# Patient Record
Sex: Female | Born: 2000 | Race: Black or African American | Hispanic: No | Marital: Single | Smoking: Never smoker
Health system: Southern US, Community
[De-identification: ages and names within clinical notes are randomized; demographics above are authoritative.]

## PROBLEM LIST (undated history)

## (undated) DIAGNOSIS — L709 Acne, unspecified: Secondary | ICD-10-CM

## (undated) HISTORY — PX: WISDOM TOOTH EXTRACTION: SHX21

## (undated) HISTORY — DX: Acne, unspecified: L70.9

---
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
## 2022-04-30 ENCOUNTER — Encounter: Payer: Self-pay | Admitting: Radiology

## 2022-04-30 ENCOUNTER — Ambulatory Visit: Payer: Managed Care, Other (non HMO) | Admitting: Radiology

## 2022-04-30 VITALS — BP 102/60 | Ht 67.0 in | Wt 122.0 lb

## 2022-04-30 DIAGNOSIS — N926 Irregular menstruation, unspecified: Secondary | ICD-10-CM

## 2022-04-30 LAB — PREGNANCY, URINE: Preg Test, Ur: NEGATIVE

## 2022-04-30 NOTE — Progress Notes (Signed)
      Subjective: Anna Mcdonald is a 22 y.o. female who complains of irregular menses since 09/2021. Skipping menses and having 2 menses the next month. Vaginal spotting started 02/22/2 through today. LNMP: 04/02/22. Began a few months after starting Accutane    Review of Systems  All other systems reviewed and are negative.   History reviewed. No pertinent past medical history.    Objective:  Today's Vitals   04/30/22 1039  BP: 102/60  Weight: 122 lb (55.3 kg)  Height: '5\' 7"'$  (1.702 m)   Body mass index is 19.11 kg/m.   Physical Exam Vitals and nursing note reviewed. Exam conducted with a chaperone present.  Constitutional:      Appearance: She is normal weight.  Pulmonary:     Effort: Pulmonary effort is normal.  Abdominal:     General: Abdomen is flat. Bowel sounds are normal.     Palpations: Abdomen is soft.  Genitourinary:    General: Normal vulva.     Uterus: Normal.      Adnexa: Right adnexa normal and left adnexa normal.  Lymphadenopathy:     Lower Body: No right inguinal adenopathy.  Neurological:     Mental Status: She is alert. Mental status is at baseline.  Psychiatric:        Mood and Affect: Mood normal.        Thought Content: Thought content normal.        Judgment: Judgment normal.      Leatrice Jewels, CMA present for exam  Assessment:/Plan:   1. Irregular periods Reassured it may be related to Accutane use Declines hormonal management at this time - Pregnancy, urine   Call if she changes her mind about hormonal [facility]

## 2022-08-06 ENCOUNTER — Ambulatory Visit (HOSPITAL_COMMUNITY)
Admission: EM | Admit: 2022-08-06 | Discharge: 2022-08-06 | Disposition: A | Discharge status: Home / Self Care | Payer: Managed Care, Other (non HMO) | Attending: Emergency Medicine | Admitting: Emergency Medicine

## 2022-08-06 ENCOUNTER — Other Ambulatory Visit: Payer: Self-pay

## 2022-08-06 DIAGNOSIS — R079 Chest pain, unspecified: Secondary | ICD-10-CM | POA: Diagnosis not present

## 2022-08-06 NOTE — ED Provider Notes (Signed)
[facility]    CSN: [id number] Arrival date & time: 08/06/22  1800      History   Chief Complaint Chief Complaint  Patient presents with   Chest Pain    Pt states she is having CP for a week, denies any SOB.     HPI Anna Mcdonald is a 22 y.o. female.   Patient presents to clinic complaining of chest pain that has been intermittent but worsening for the past week.  Feels like someone is sitting grabbing her heart and squeezing it, this happens intermittently, most recently happened about an hour ago in the lobby.  She has been having ongoing chest pains for months, usually after eating.  These are different because they occur intermittently, at rest, while laying down, eating or during activity.  Yesterday she had no pain, today she has had frequent pains.  She denies any shortness of breath, cough or recent illness. Irregular menses.   Her mother has a history of thyroid disease.  Mother is concerned over diet and Boba tea/caffeine intake, reports patient does not drink water.    The history is provided by the patient and medical records.  Chest Pain Associated symptoms: no cough, no fever, no palpitations and no shortness of breath     No past medical history on file.  There are no problems to display for this patient.   No past surgical history on file.  OB History     Gravida  0   Para  0   Term  0   Preterm  0   AB  0   Living  0      SAB  0   IAB  0   Ectopic  0   Multiple  0   Live Births  0            Home Medications    Prior to Admission medications   Not on File    Family History Family History  Problem Relation Age of Onset   Thyroid disease Mother     Social History Social History   Tobacco Use   Smoking status: Never    Passive exposure: Never   Smokeless tobacco: Never  Substance Use Topics   Alcohol use: Never   Drug use: Never     Allergies   Patient has no known allergies.   Review of  Systems Review of Systems  Constitutional:  Negative for fever.  Respiratory:  Negative for cough and shortness of breath.   Cardiovascular:  Positive for chest pain. Negative for palpitations.  Genitourinary:  Positive for menstrual problem.     Physical Exam Triage Vital Signs ED Triage Vitals [08/06/22 1831]  Enc Vitals Group     BP      Pulse      Resp      Temp      Temp src      SpO2      Weight 123 lb 7.3 oz (56 kg)     Height 5\' 7"  (1.702 m)     Head Circumference      Peak Flow      Pain Score 3     Pain Loc      Pain Edu?      Excl. in GC?    No data found.  Updated Vital Signs BP 96/61   Pulse 83   Temp 98 F (36.7 C)   Resp 16   Ht 5\' 7"  (1.702 m)  Wt 123 lb 7.3 oz (56 kg)   SpO2 99%   BMI 19.34 kg/m   Visual Acuity Right Eye Distance:   Left Eye Distance:   Bilateral Distance:    Right Eye Near:   Left Eye Near:    Bilateral Near:     Physical Exam Vitals and nursing note reviewed.  Constitutional:      Appearance: She is well-developed.  HENT:     Head: Normocephalic and atraumatic.  Eyes:     Pupils: Pupils are equal, round, and reactive to light.  Cardiovascular:     Rate and Rhythm: Normal rate and regular rhythm.     Heart sounds: Normal heart sounds. No murmur heard. Pulmonary:     Effort: Pulmonary effort is normal.     Breath sounds: Normal breath sounds. No decreased breath sounds.  Musculoskeletal:        General: Normal range of motion.     Cervical back: Normal range of motion.  Skin:    General: Skin is warm and dry.  Neurological:     General: No focal deficit present.     Mental Status: She is alert.  Psychiatric:        Mood and Affect: Mood normal.      UC Treatments / Results  Labs (all labs ordered are listed, but only abnormal results are displayed) Labs Reviewed - No data to display  EKG   Radiology No results found.  Procedures Procedures (including critical care time)  Medications Ordered  in UC Medications - No data to display  Initial Impression / Assessment and Plan / UC Course  I have reviewed the triage vital signs and the nursing notes.  Pertinent labs & imaging results that were available during my care of the patient were reviewed by me and considered in my medical decision making (see chart for details).  Vitals and triage reviewed, patient is hemodynamically stable.  Intermittent chest pains that have been worsening over the past week.  EKG shows normal sinus rhythm at 69 bpm, without ST elevation or ST depression. Does have rightward axis, patient w/o dyspnea, SOB or other concerning symptoms, suspect normal variation.  Advise follow-up with cardiology if symptoms persist for further evaluation.  Encouraged to establish with a PCP, patient does have irregular menses and mother has a history of thyroid disease, suggested routine labs with PCP for further evaluation.  Plan of care, follow-up care and return precautions given, no questions at this time.     Final Clinical Impressions(s) / UC Diagnoses   Final diagnoses:  Nonspecific chest pain     Discharge Instructions      Your rhythm is regular on your EKG.  Please follow-up with a primary care provider for further evaluation of your chest pains and irregular menstrual cycles, as they will be able to obtain routine labs.  I suggest cutting down on your caffeine intake, and ensure you are drinking at least 64 ounces of water.  Please ensure you are eating a balanced diet with plenty of fruits and vegetables.  If your chest pains persist, you can consider following up with a cardiologist, I have attached a female to contact if needed.   Please return to clinic for any new or concerning symptoms.       ED Prescriptions   None    PDMP not reviewed this encounter.   Nohea Kras, [place], [place] 08/06/22 Ernestina [place]

## 2022-08-06 NOTE — Discharge Instructions (Addendum)
Your rhythm is regular on your EKG.  Please follow-up with a primary care provider for further evaluation of your chest pains and irregular menstrual cycles, as they will be able to obtain routine labs.  I suggest cutting down on your caffeine intake, and ensure you are drinking at least 64 ounces of water.  Please ensure you are eating a balanced diet with plenty of fruits and vegetables.  If your chest pains persist, you can consider following up with a cardiologist, I have attached a female to contact if needed.   Please return to clinic for any new or concerning symptoms.

## 2022-08-21 ENCOUNTER — Telehealth: Payer: Self-pay

## 2022-08-21 ENCOUNTER — Ambulatory Visit: Payer: Managed Care, Other (non HMO) | Admitting: Internal Medicine

## 2022-08-21 NOTE — Telephone Encounter (Signed)
6.27.24  no show new pt, pt blocked for future appt. I did not send a no show letter

## 2023-11-02 ENCOUNTER — Encounter: Payer: Self-pay | Admitting: Physician Assistant

## 2023-11-02 ENCOUNTER — Ambulatory Visit: Admitting: Physician Assistant

## 2023-11-02 VITALS — BP 104/64 | HR 97 | Temp 98.2°F | Ht 67.5 in | Wt 118.6 lb

## 2023-11-02 DIAGNOSIS — L700 Acne vulgaris: Secondary | ICD-10-CM | POA: Diagnosis not present

## 2023-11-02 DIAGNOSIS — Z23 Encounter for immunization: Secondary | ICD-10-CM

## 2023-11-02 MED ORDER — TRETINOIN 0.025 % EX CREA: TOPICAL_CREAM | Freq: Every day | CUTANEOUS | 2 refills | Status: AC

## 2023-11-02 NOTE — Progress Notes (Signed)
 Patient ID: Maebel Mimnaugh, female    DOB: [birth year], 23 y.o.   MRN: [id number]   Assessment & Plan:  Acne vulgaris -     Tretinoin ; Apply topically at bedtime.  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 History of use of Accutane during teenage years. Currently using tretinoin  0.025% cream. Acne exacerbates around menstrual cycles. Current regimen includes hyaluronic acid and retinol acid. Skin appears clear today, likely due to current skincare regimen. - Prescribe tretinoin  0.025% cream with refills. - Advise application of tretinoin  at bedtime. - Consider dermatology referral if condition worsens.      Return in about 1 year (around 11/01/2024) for recheck/follow-up.    Subjective:    Chief Complaint  Patient presents with   New Patient (Initial Visit)    New pt in office to est care with PCP; pt has no concerns other than skin issues and wants to discuss starting back on tretinoin  cream;     HPI Discussed the use of AI scribe software for clinical note transcription with the patient, who gave verbal consent to proceed.  History of Present Illness Clydie Biller is a 23 year old female who presents with concerns about acne management. New pt establishment.   She has a long-standing history of acne, which began in middle school. Initially treated with tretinoin , her acne worsened in high school, leading to treatment with isotretinoin (Accutane) for about a year. After discontinuation, her acne returned. During a recent trip to [place], she was prescribed tretinoin  0.025% cream, which she found effective. However, since running out of the medication, her acne has flared up again, particularly worsening around her menstrual cycles.  She currently uses hyaluronic acid and a retinol  acid product as part of her skincare regimen.  No use of tobacco, alcohol, or vaping products. She lives with her parents and has a younger brother. 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  Her maternal grandfather passed away and her mother has a history of hyperthyroidism, specifically Graves' disease.     Past Medical History:  Diagnosis Date   Acne    Encounter for well child visit at 38 years of age 52/28/2022    Past Surgical History:  Procedure Laterality Date   WISDOM TOOTH EXTRACTION Bilateral     Family History  Problem Relation Age of Onset   Hyperthyroidism Mother    Healthy Father    Healthy Brother    COPD Maternal Grandfather        smoking history    Social History   Tobacco Use   Smoking status: Never    Passive exposure: Never   Smokeless tobacco: Never   Tobacco comments:    none  Vaping Use   Vaping status: Never Used  Substance Use Topics   Alcohol use: Never   Drug use: Never     No Known Allergies  Review of Systems NEGATIVE UNLESS OTHERWISE INDICATED IN HPI      Objective:     BP 104/64 (BP Location: Left Arm, Patient Position: Sitting, Cuff Size: Normal)   Pulse 97   Temp 98.2 F (36.8 C) (Temporal)   Ht 5' 7.5 (1.715 m)   Wt 118 lb 9.6 oz (53.8 kg)   LMP 10/30/2023 (Exact Date)   SpO2 99%  BMI 18.30 kg/m   Wt Readings from Last 3 Encounters:  11/02/23 118 lb 9.6 oz (53.8 kg)  08/06/22 123 lb 7.3 oz (56 kg) (42%, Z= -0.21)*  04/30/22 122 lb (55.3 kg) (40%, Z= -0.26)*   * Growth percentiles are based on CDC (Girls, 2-20 Years) data.    BP Readings from Last 3 Encounters:  11/02/23 104/64  08/06/22 96/61  04/30/22 102/60     Physical Exam Vitals and nursing note reviewed.  Constitutional:      Appearance: Normal appearance. She is normal weight. She is not toxic-appearing.  HENT:     Head: Normocephalic and atraumatic.     Right Ear: External ear normal.     Left Ear: External ear normal.  Eyes:      Extraocular Movements: Extraocular movements intact.     Conjunctiva/sclera: Conjunctivae normal.     Pupils: Pupils are equal, round, and reactive to light.  Neck:     Thyroid: No thyroid mass, thyromegaly or thyroid tenderness.  Cardiovascular:     Rate and Rhythm: Normal rate and regular rhythm.     Pulses: Normal pulses.     Heart sounds: Normal heart sounds.  Pulmonary:     Effort: Pulmonary effort is normal.     Breath sounds: Normal breath sounds.  Musculoskeletal:        General: Normal range of motion.     Cervical back: Normal range of motion and neck supple.  Skin:    General: Skin is warm and dry.  Neurological:     General: No focal deficit present.     Mental Status: She is alert and oriented to person, place, and time.  Psychiatric:        Mood and Affect: Mood normal.        Behavior: Behavior normal.        Thought Content: Thought content normal.        Judgment: Judgment normal.             Jaleen Finch M Rodriques Badie, PA-C

## 2023-11-02 NOTE — Patient Instructions (Signed)
 Welcome to [facility]! It was a pleasure meeting you today.   VISIT SUMMARY: Today we discussed your ongoing acne management. You have a history of severe acne, which has recently flared up again, especially around your menstrual cycles. Your skin appears clear today, likely due to your current skincare regimen.  YOUR PLAN: ACNE VULGARIS: You have chronic acne that worsens around your menstrual cycles. Your current regimen includes hyaluronic acid and retinol acid, and you have previously found tretinoin  0.025% cream effective. -Continue using tretinoin  0.025% cream. I have prescribed it with refills. -Apply tretinoin  at bedtime. -If your condition worsens, we may consider a referral to a dermatologist.     PLEASE NOTE:  If you had any LAB tests please let us  know if you have not heard back within a few days. You may see your results on MyChart before we have a chance to review them but we will give you a call once they are reviewed by us . If we ordered any REFERRALS today, please let us  know if you have not heard from their office within the next two weeks. Let us  know through MyChart if you are needing REFILLS, or have your pharmacy send us  the request. You can also use MyChart to communicate with me or any office staff.  Please try these tips to maintain a healthy lifestyle:  Eat most of your calories during the day when you are active. Eliminate processed foods including packaged sweets (pies, cakes, cookies), reduce intake of potatoes, white bread, white pasta, and white rice. Look for whole grain options, oat flour or almond flour.  Each meal should contain half fruits/vegetables, one quarter protein, and one quarter carbs (no bigger than a computer mouse).  Cut down on sweet beverages. This includes juice, soda, and sweet tea. Also watch fruit intake, though this is a healthier sweet option, it still contains natural sugar! Limit to 3 servings  daily.  Drink at least 1 glass of water with each meal and aim for at least 8 glasses (64 ounces) per day.  Exercise at least 150 minutes every week to the best of your ability.    Take Care,  Ireoluwa Grant, PA-C

## 2023-11-19 ENCOUNTER — Encounter: Payer: Self-pay | Admitting: Physician Assistant

## 2023-11-19 ENCOUNTER — Ambulatory Visit: Admitting: Physician Assistant

## 2023-11-19 VITALS — BP 104/62 | HR 67 | Temp 98.2°F | Ht 67.5 in | Wt 116.8 lb

## 2023-11-19 DIAGNOSIS — B079 Viral wart, unspecified: Secondary | ICD-10-CM | POA: Diagnosis not present

## 2023-11-19 NOTE — Progress Notes (Signed)
 Patient ID: Anna Mcdonald, female    DOB: [birth year], 23 y.o.   MRN: [id number]   Assessment & Plan:  Viral wart on finger   Assessment & Plan Wart on right middle finger Wart located on the distal end of the right middle finger, persistent despite over-the-counter treatments. Suspected recent acquisition from a nail salon. - Perform cryotherapy in office. - Provide aftercare instructions. - Schedule follow-up in two weeks to assess need for additional cryotherapy. - Advise to call if any complications or concerns arise.   Procedure explained, consent obtained. Cryotherapy performed on one lesion of R middle finger. Three freeze-thaw cycles with ice ring formation and deformation in between each cycle. Anna Mcdonald tolerated procedure well. Aftercare instructions provided.    Return in about 2 weeks (around 12/03/2023) for wart refreeze .    Subjective:    Chief Complaint  Patient presents with   Wart removal    Anna Mcdonald in office and wanting to remove wart from middle finger on right hand; has tried OTC patches but always comes back;     HPI Discussed the use of AI scribe software for clinical note transcription with the patient, who gave verbal consent to proceed.  History of Present Illness Anna Mcdonald is a 23 year old female who presents with a recurrent wart on her right middle finger.  She reports a wart on her right middle finger that has been recurring despite the use of over-the-counter patches and sprays. She suspects that she acquired the wart from a nail salon.     Past Medical History:  Diagnosis Date   Acne    Encounter for well child visit at 10 years of age 64/28/2022    Past Surgical History:  Procedure Laterality Date   WISDOM TOOTH EXTRACTION Bilateral     Family History  Problem Relation Age of Onset   Hyperthyroidism Mother    Healthy Father    Healthy Brother    COPD Maternal Grandfather        smoking history    Social History   Tobacco Use    Smoking status: Never    Passive exposure: Never   Smokeless tobacco: Never   Tobacco comments:    none  Vaping Use   Vaping status: Never Used  Substance Use Topics   Alcohol use: Never   Drug use: Never     No Known Allergies  Review of Systems NEGATIVE UNLESS OTHERWISE INDICATED IN HPI      Objective:     BP 104/62 (BP Location: Left Arm, Patient Position: Sitting, Cuff Size: Normal)   Pulse 67   Temp 98.2 F (36.8 C) (Temporal)   Ht 5' 7.5 (1.715 m)   Wt 116 lb 12.8 oz (53 kg)   LMP 10/30/2023 (Exact Date)   SpO2 100%   BMI 18.02 kg/m   Wt Readings from Last 3 Encounters:  11/19/23 116 lb 12.8 oz (53 kg)  11/02/23 118 lb 9.6 oz (53.8 kg)  08/06/22 123 lb 7.3 oz (56 kg) (42%, Z= -0.21)*   * Growth percentiles are based on CDC (Girls, 2-20 Years) data.    BP Readings from Last 3 Encounters:  11/19/23 104/62  11/02/23 104/64  08/06/22 96/61     Physical Exam Vitals and nursing note reviewed.  Constitutional:      Appearance: Normal appearance.  Musculoskeletal:       Hands:  Neurological:     Mental Status: She is alert.  Gessica Jawad M Frida Wahlstrom, PA-C

## 2024-04-13 ENCOUNTER — Ambulatory Visit: Admitting: Radiology

## 2024-11-07 ENCOUNTER — Ambulatory Visit: Admitting: Physician Assistant
